# Patient Record
Sex: Female | Born: 1965 | Hispanic: Yes | Marital: Single
Health system: Southern US, Community
[De-identification: ages and names within clinical notes are randomized; demographics above are authoritative.]

## PROBLEM LIST (undated history)

## (undated) HISTORY — PX: BREAST CYST ASPIRATION: SHX578

---

## 2005-11-19 ENCOUNTER — Ambulatory Visit: Payer: Self-pay | Admitting: Family Medicine

## 2013-08-21 ENCOUNTER — Ambulatory Visit: Payer: Self-pay | Admitting: Family Medicine

## 2014-11-25 ENCOUNTER — Ambulatory Visit: Payer: Self-pay

## 2014-11-27 ENCOUNTER — Ambulatory Visit: Payer: Self-pay

## 2014-12-11 ENCOUNTER — Ambulatory Visit
Admission: RE | Admit: 2014-12-11 | Discharge: 2014-12-11 | Disposition: A | Discharge status: Home / Self Care | Payer: Self-pay | Source: Ambulatory Visit | Attending: Oncology | Admitting: Oncology

## 2014-12-11 ENCOUNTER — Ambulatory Visit: Payer: Self-pay | Attending: Oncology

## 2014-12-11 VITALS — BP 106/69 | HR 71 | Temp 99.0°F | Ht 62.0 in | Wt 114.2 lb

## 2014-12-11 DIAGNOSIS — Z0000 Encounter for general adult medical examination without abnormal findings: Secondary | ICD-10-CM

## 2014-12-11 NOTE — Progress Notes (Signed)
Subjective:     Patient ID: Veronica Campbell, female   DOB: 1965/06/24, 49 y.o.   MRN: [id number]  HPI   Review of Systems     Objective:   Physical Exam  Pulmonary/Chest: Right breast exhibits no inverted nipple, no mass, no nipple discharge, no skin change and no tenderness. Left breast exhibits no inverted nipple, no mass, no nipple discharge, no skin change and no tenderness. Breasts are asymmetrical.       Assessment:     49 year old hispanic  patient presents for [facility] clinic visitPatient screened, and meets [facility] eligibility.  Patient does not have insurance, Medicare or Medicaid.  Handout given on Affordable Care Act.  CBE unremarkable.  Right breast 1 cup size larger than left.  Instructed patient on breast self-exam using teach back method. Plan:     Sent for bilateral screening mammogram.  Veronica Campbell interpreted exam.

## 2014-12-15 NOTE — Progress Notes (Signed)
Letter mailed from [facility] to notify of normal mammogram results.  Patient to return in one year for annual screening.  Copy to [facility]. 

## 2016-03-31 ENCOUNTER — Ambulatory Visit: Payer: Self-pay | Attending: Oncology

## 2016-09-08 ENCOUNTER — Other Ambulatory Visit: Payer: Self-pay | Admitting: Primary Care

## 2017-08-17 ENCOUNTER — Ambulatory Visit: Payer: Self-pay | Attending: Oncology

## 2017-08-17 ENCOUNTER — Encounter (INDEPENDENT_AMBULATORY_CARE_PROVIDER_SITE_OTHER): Payer: Self-pay

## 2017-08-17 ENCOUNTER — Ambulatory Visit
Admission: RE | Admit: 2017-08-17 | Discharge: 2017-08-17 | Disposition: A | Discharge status: Home / Self Care | Payer: Self-pay | Source: Ambulatory Visit | Attending: Oncology | Admitting: Oncology

## 2017-08-17 VITALS — BP 113/78 | HR 64 | Temp 97.7°F | Ht 63.0 in | Wt 126.0 lb

## 2017-08-17 DIAGNOSIS — Z0000 Encounter for general adult medical examination without abnormal findings: Secondary | ICD-10-CM | POA: Insufficient documentation

## 2017-08-17 NOTE — Progress Notes (Signed)
  Subjective:     Patient ID: Farrel GordonNorma Lopez Flores, female   DOB: 1965/12/03, 52 y.o.   MRN: [id number]  HPI   Review of Systems     Objective:   Physical Exam  Pulmonary/Chest: Right breast exhibits no inverted nipple, no mass, no nipple discharge, no skin change and no tenderness. Left breast exhibits no inverted nipple, no mass, no nipple discharge, no skin change and no tenderness. Breasts are asymmetrical.  Left breast smaller than right       Assessment:     52 year old hispanic [facility]  Patient screened, and meets [facility] eligibility.  Patient does not have insurance, Medicare or Medicaid.  Handout given on Affordable Care Act.  Instructed patient on breast self awareness using teach back method.  Clinical breast exam unremarkable.  No mass or lump palpated.  Patient has 3 children,  The oldest is 153 years old.      Plan:     Sent for bilateral screening mammogram.

## 2017-08-31 NOTE — Progress Notes (Signed)
Risk Assessment    No risk assessment data for the current encounter   Risk Scores      08/17/2017   Last edited by: Jim LikeLambert, Sheena M, RN   5-year risk: 0.7 %   Lifetime risk: 6.4 %        [facility].  Patient to return in one year for annual screening.  Copy to [facility].

## 2018-08-30 ENCOUNTER — Telehealth: Payer: Self-pay

## 2018-08-30 ENCOUNTER — Other Ambulatory Visit: Payer: Self-pay

## 2018-08-30 DIAGNOSIS — Z20822 Contact with and (suspected) exposure to covid-19: Secondary | ICD-10-CM

## 2018-08-30 NOTE — Telephone Encounter (Signed)
Veronica Gala, RN at [facility] called to refer the patient for covid testing. Patient's son Veronica Campbell called and advised of the referral, he verbalized understanding. Appointment scheduled for today at 1500 at [facility], advised of location and to wear a mask for everyone in the vehicle, he verbalized understanding. Order placed.

## 2018-09-04 LAB — NOVEL CORONAVIRUS, NAA: SARS-CoV-2, NAA: NOT DETECTED

## 2020-01-11 ENCOUNTER — Emergency Department
Admission: EM | Admit: 2020-01-11 | Discharge: 2020-01-11 | Disposition: A | Discharge status: Home / Self Care | Payer: Self-pay | Attending: Emergency Medicine | Admitting: Emergency Medicine

## 2020-01-11 DIAGNOSIS — Z5321 Procedure and treatment not carried out due to patient leaving prior to being seen by health care provider: Secondary | ICD-10-CM | POA: Insufficient documentation

## 2020-01-11 DIAGNOSIS — Y92410 Unspecified street and highway as the place of occurrence of the external cause: Secondary | ICD-10-CM | POA: Insufficient documentation

## 2020-01-11 DIAGNOSIS — M542 Cervicalgia: Secondary | ICD-10-CM | POA: Insufficient documentation

## 2020-01-11 DIAGNOSIS — M549 Dorsalgia, unspecified: Secondary | ICD-10-CM | POA: Insufficient documentation
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
## 2020-01-24 ENCOUNTER — Other Ambulatory Visit: Payer: Self-pay | Admitting: Primary Care

## 2020-01-24 DIAGNOSIS — Z1231 Encounter for screening mammogram for malignant neoplasm of breast: Secondary | ICD-10-CM

## 2020-02-05 ENCOUNTER — Other Ambulatory Visit: Payer: Self-pay | Admitting: Primary Care

## 2020-02-05 DIAGNOSIS — R590 Localized enlarged lymph nodes: Secondary | ICD-10-CM

## 2020-02-06 ENCOUNTER — Ambulatory Visit: Payer: Self-pay

## 2020-04-10 ENCOUNTER — Other Ambulatory Visit: Payer: Self-pay | Admitting: Neurology

## 2020-04-10 ENCOUNTER — Other Ambulatory Visit (HOSPITAL_COMMUNITY): Payer: Self-pay | Admitting: Neurology

## 2020-04-10 DIAGNOSIS — M7918 Myalgia, other site: Secondary | ICD-10-CM

## 2020-04-10 DIAGNOSIS — G44311 Acute post-traumatic headache, intractable: Secondary | ICD-10-CM

## 2020-04-10 DIAGNOSIS — G43019 Migraine without aura, intractable, without status migrainosus: Secondary | ICD-10-CM

## 2020-04-25 ENCOUNTER — Other Ambulatory Visit: Payer: Self-pay

## 2020-04-25 ENCOUNTER — Ambulatory Visit
Admission: RE | Admit: 2020-04-25 | Discharge: 2020-04-25 | Disposition: A | Discharge status: Home / Self Care | Payer: Commercial Managed Care - PPO | Source: Ambulatory Visit | Attending: Neurology | Admitting: Neurology

## 2020-04-25 DIAGNOSIS — G44311 Acute post-traumatic headache, intractable: Secondary | ICD-10-CM | POA: Insufficient documentation

## 2020-04-25 DIAGNOSIS — M7918 Myalgia, other site: Secondary | ICD-10-CM | POA: Diagnosis present

## 2020-04-25 DIAGNOSIS — G43019 Migraine without aura, intractable, without status migrainosus: Secondary | ICD-10-CM | POA: Diagnosis present

## 2020-09-15 ENCOUNTER — Other Ambulatory Visit: Payer: Self-pay | Admitting: Orthopedic Surgery

## 2020-09-15 ENCOUNTER — Other Ambulatory Visit (HOSPITAL_COMMUNITY): Payer: Self-pay | Admitting: Orthopedic Surgery

## 2020-09-15 DIAGNOSIS — M62838 Other muscle spasm: Secondary | ICD-10-CM

## 2020-09-15 DIAGNOSIS — M47812 Spondylosis without myelopathy or radiculopathy, cervical region: Secondary | ICD-10-CM

## 2020-09-15 DIAGNOSIS — M5412 Radiculopathy, cervical region: Secondary | ICD-10-CM

## 2020-09-23 ENCOUNTER — Ambulatory Visit: Payer: Commercial Managed Care - PPO

## 2021-02-10 ENCOUNTER — Other Ambulatory Visit (HOSPITAL_COMMUNITY): Payer: Self-pay | Admitting: Primary Care

## 2021-02-10 DIAGNOSIS — Z1231 Encounter for screening mammogram for malignant neoplasm of breast: Secondary | ICD-10-CM

## 2021-02-18 ENCOUNTER — Ambulatory Visit (HOSPITAL_COMMUNITY): Payer: Commercial Managed Care - PPO

## 2021-02-18 ENCOUNTER — Other Ambulatory Visit: Payer: Self-pay

## 2021-02-18 ENCOUNTER — Ambulatory Visit
Admission: RE | Admit: 2021-02-18 | Discharge: 2021-02-18 | Disposition: A | Discharge status: Home / Self Care | Payer: Commercial Managed Care - PPO | Source: Ambulatory Visit | Attending: Primary Care | Admitting: Primary Care

## 2021-02-18 ENCOUNTER — Other Ambulatory Visit: Payer: Self-pay | Admitting: Primary Care

## 2021-02-18 DIAGNOSIS — Z1231 Encounter for screening mammogram for malignant neoplasm of breast: Secondary | ICD-10-CM | POA: Insufficient documentation

## 2021-11-02 ENCOUNTER — Other Ambulatory Visit: Payer: Self-pay | Admitting: Primary Care

## 2021-11-02 DIAGNOSIS — Z1231 Encounter for screening mammogram for malignant neoplasm of breast: Secondary | ICD-10-CM

## 2022-02-19 ENCOUNTER — Ambulatory Visit
Admission: RE | Admit: 2022-02-19 | Discharge: 2022-02-19 | Disposition: A | Discharge status: Home / Self Care | Payer: BLUE CROSS/BLUE SHIELD | Source: Ambulatory Visit | Attending: Primary Care | Admitting: Primary Care

## 2022-02-19 DIAGNOSIS — Z1231 Encounter for screening mammogram for malignant neoplasm of breast: Secondary | ICD-10-CM | POA: Diagnosis present

## 2022-03-22 ENCOUNTER — Other Ambulatory Visit: Payer: Self-pay | Admitting: Primary Care

## 2022-03-22 DIAGNOSIS — R945 Abnormal results of liver function studies: Secondary | ICD-10-CM

## 2022-03-30 ENCOUNTER — Ambulatory Visit: Admission: RE | Admit: 2022-03-30 | Payer: BLUE CROSS/BLUE SHIELD | Source: Ambulatory Visit

## 2022-04-21 ENCOUNTER — Other Ambulatory Visit: Payer: Self-pay | Admitting: Orthopedic Surgery

## 2022-04-21 DIAGNOSIS — M5416 Radiculopathy, lumbar region: Secondary | ICD-10-CM

## 2022-04-27 ENCOUNTER — Other Ambulatory Visit: Payer: Self-pay | Admitting: Physical Medicine & Rehabilitation

## 2022-04-27 DIAGNOSIS — M9931 Osseous stenosis of neural canal of cervical region: Secondary | ICD-10-CM

## 2022-07-21 IMAGING — MG MM DIGITAL SCREENING BILAT W/ TOMO AND CAD
8 series · 9 of 24 positions shown · non-contrast
Comparison: Previous exam(s).

CLINICAL DATA: Screening.

EXAM:
DIGITAL SCREENING BILATERAL MAMMOGRAM WITH TOMOSYNTHESIS AND CAD
TECHNIQUE: Bilateral screening digital craniocaudal and mediolateral oblique
mammograms were obtained. Bilateral screening digital breast
tomosynthesis was performed. The images were evaluated with
computer-aided detection.

[L MLO synth-2D]
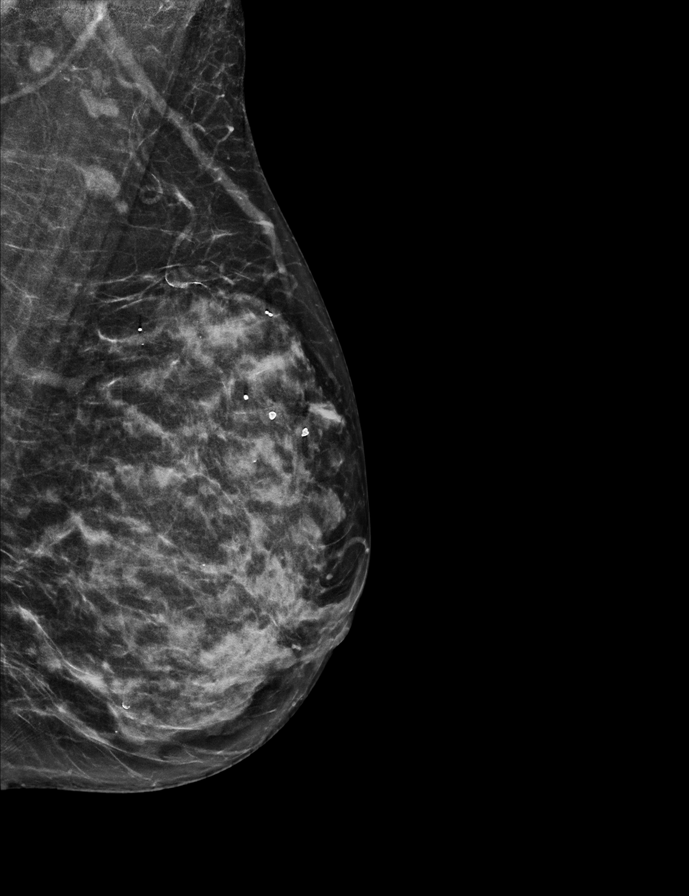

[L CC synth-2D]
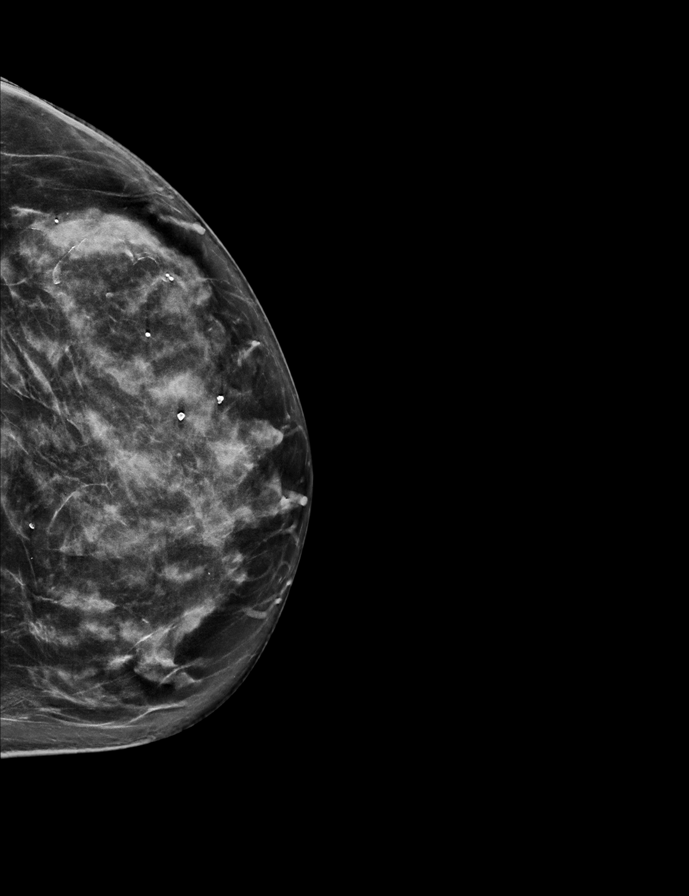

[R MLO synth-2D]
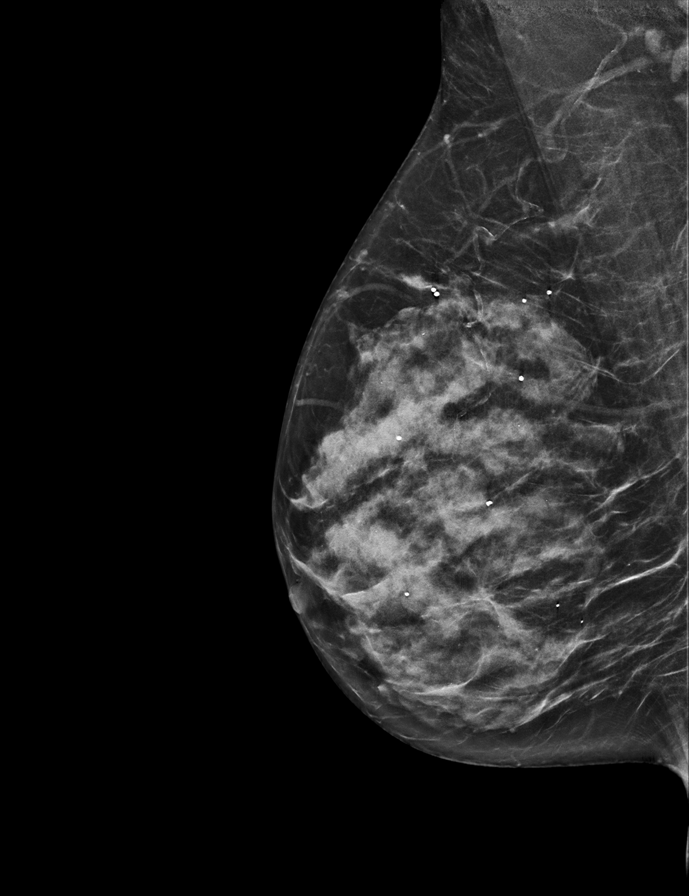

[R CC synth-2D]
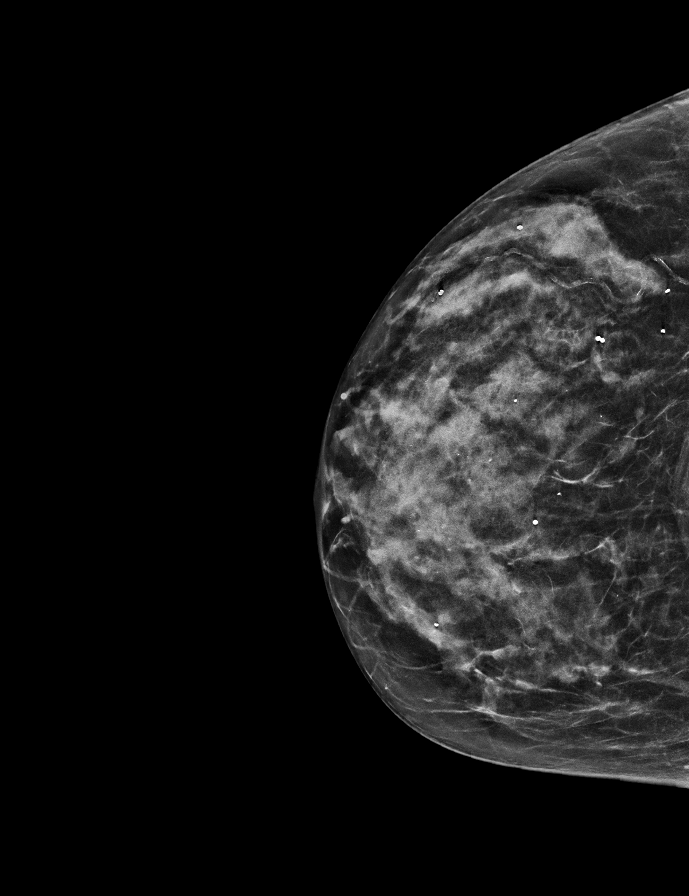

[L MLO tomo · 2 of 57 frames shown]
[frame 19/57]
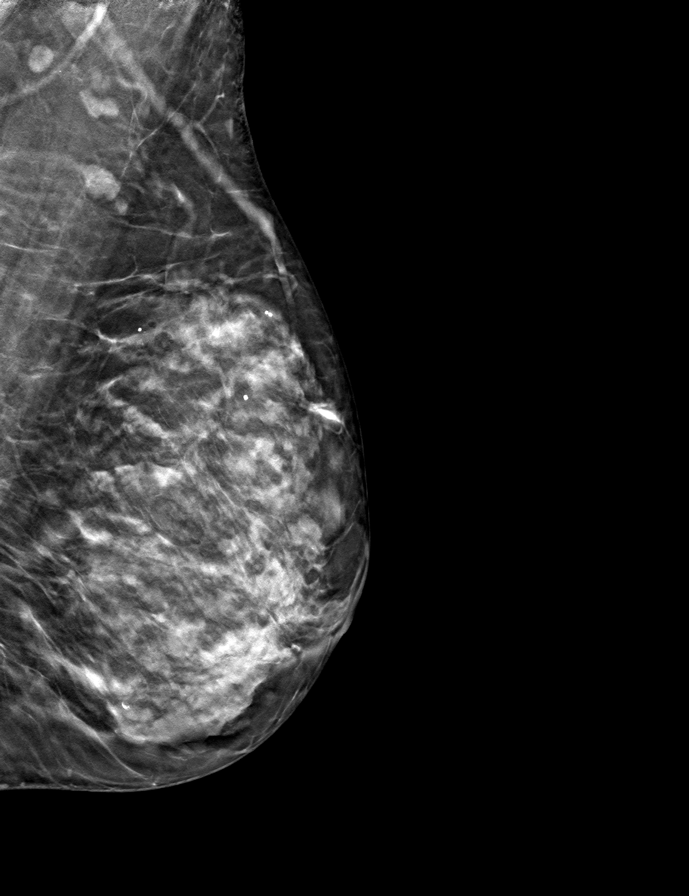
[frame 29/57]
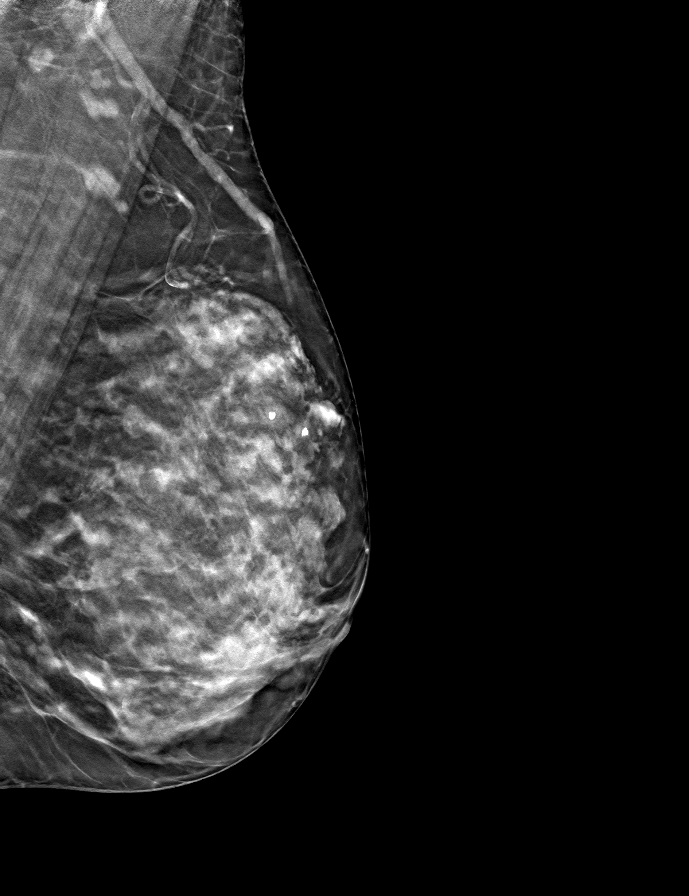

[L CC tomo · tomo slice 31/60.0]
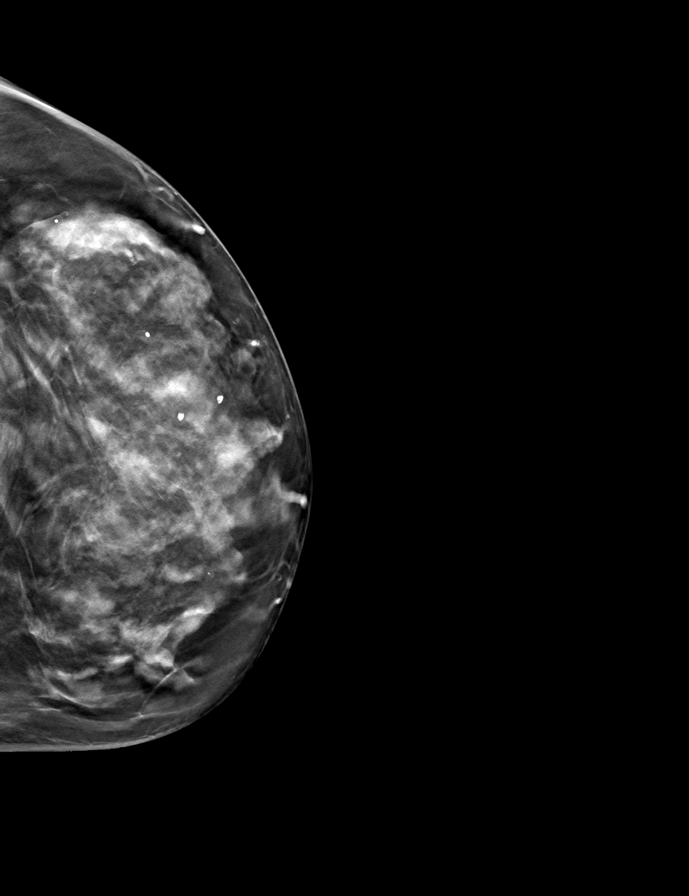

[R MLO tomo · tomo slice 29/58.0]
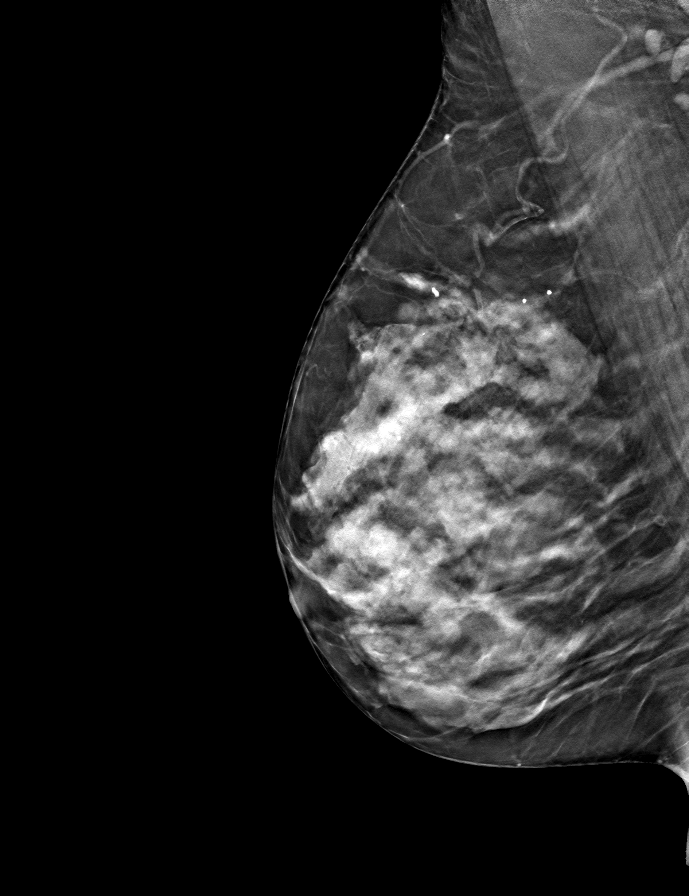

[R CC tomo · tomo slice 27/52.0]
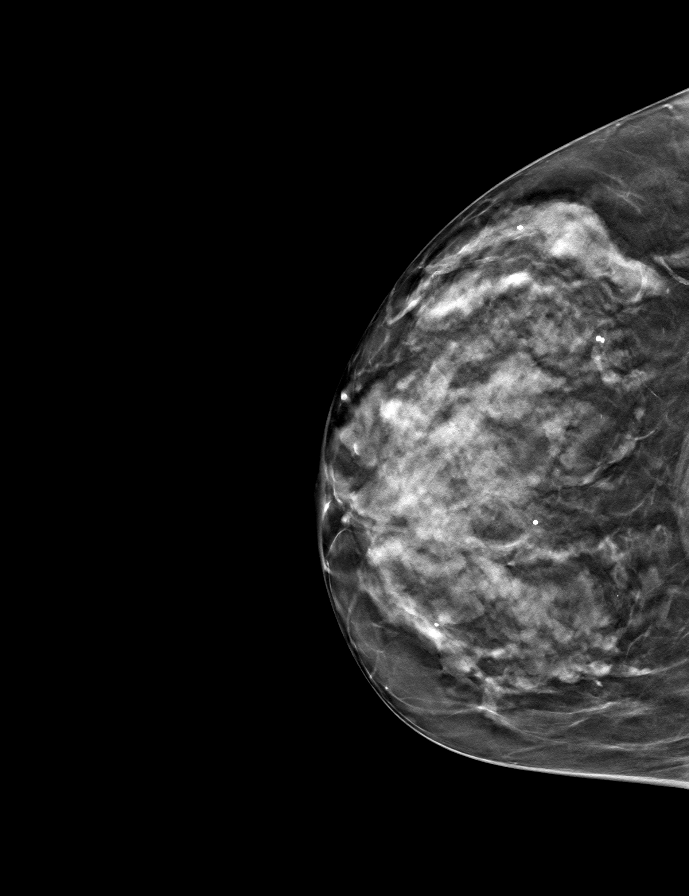

[9 of 24 positions shown; findings below may reference images not displayed]

ACR Breast Density Category c: The breast tissue is heterogeneously
dense, which may obscure small masses.
FINDINGS: There are no findings suspicious for malignancy.
IMPRESSION: No mammographic evidence of malignancy. A result letter of this
screening mammogram will be mailed directly to the patient.

RECOMMENDATION:
Screening mammogram in one year. (Code:Q3-W-BC3)

BI-RADS CATEGORY  1: Negative.

## 2022-11-04 ENCOUNTER — Encounter: Payer: Self-pay | Admitting: Family Medicine

## 2022-11-04 DIAGNOSIS — N6325 Unspecified lump in the left breast, overlapping quadrants: Secondary | ICD-10-CM

## 2022-11-04 DIAGNOSIS — N6459 Other signs and symptoms in breast: Secondary | ICD-10-CM

## 2022-11-04 DIAGNOSIS — N644 Mastodynia: Secondary | ICD-10-CM

## 2022-11-09 ENCOUNTER — Other Ambulatory Visit: Payer: Self-pay | Admitting: Family Medicine

## 2022-11-09 DIAGNOSIS — N6459 Other signs and symptoms in breast: Secondary | ICD-10-CM

## 2022-11-09 DIAGNOSIS — N63 Unspecified lump in unspecified breast: Secondary | ICD-10-CM

## 2022-11-09 DIAGNOSIS — N644 Mastodynia: Secondary | ICD-10-CM

## 2022-11-23 ENCOUNTER — Ambulatory Visit
Admission: RE | Admit: 2022-11-23 | Discharge: 2022-11-23 | Disposition: A | Discharge status: Home / Self Care | Payer: BLUE CROSS/BLUE SHIELD | Source: Ambulatory Visit | Attending: Family Medicine | Admitting: Family Medicine

## 2022-11-23 DIAGNOSIS — N6459 Other signs and symptoms in breast: Secondary | ICD-10-CM | POA: Insufficient documentation

## 2022-11-23 DIAGNOSIS — N63 Unspecified lump in unspecified breast: Secondary | ICD-10-CM

## 2022-11-23 DIAGNOSIS — N644 Mastodynia: Secondary | ICD-10-CM | POA: Insufficient documentation

## 2022-12-29 ENCOUNTER — Other Ambulatory Visit: Payer: Self-pay | Admitting: Family Medicine

## 2022-12-29 DIAGNOSIS — N6459 Other signs and symptoms in breast: Secondary | ICD-10-CM

## 2023-01-07 ENCOUNTER — Ambulatory Visit: Admission: RE | Admit: 2023-01-07 | Payer: BLUE CROSS/BLUE SHIELD | Source: Ambulatory Visit

## 2023-01-07 ENCOUNTER — Ambulatory Visit
Admission: RE | Admit: 2023-01-07 | Discharge: 2023-01-07 | Disposition: A | Discharge status: Home / Self Care | Payer: BLUE CROSS/BLUE SHIELD | Source: Ambulatory Visit | Attending: Family Medicine | Admitting: Family Medicine

## 2023-01-07 DIAGNOSIS — N6459 Other signs and symptoms in breast: Secondary | ICD-10-CM | POA: Insufficient documentation

## 2023-01-07 MED ORDER — GADOBUTROL 1 MMOL/ML IV SOLN
6.0000 mL | Freq: Once | INTRAVENOUS | Status: AC | PRN
  Administered 2023-01-07: 6 mL via INTRAVENOUS

## 2023-07-11 ENCOUNTER — Other Ambulatory Visit: Payer: Self-pay | Admitting: Primary Care

## 2023-07-11 DIAGNOSIS — N6325 Unspecified lump in the left breast, overlapping quadrants: Secondary | ICD-10-CM

## 2023-07-20 ENCOUNTER — Ambulatory Visit
Admission: RE | Admit: 2023-07-20 | Discharge: 2023-07-20 | Disposition: A | Discharge status: Home / Self Care | Source: Ambulatory Visit | Attending: Primary Care | Admitting: Primary Care

## 2023-07-20 DIAGNOSIS — N6325 Unspecified lump in the left breast, overlapping quadrants: Secondary | ICD-10-CM | POA: Diagnosis present

## 2023-07-20 MED ORDER — GADOBUTROL 1 MMOL/ML IV SOLN
6.0000 mL | Freq: Once | INTRAVENOUS | Status: AC | PRN
  Administered 2023-07-20: 6 mL via INTRAVENOUS

## 2023-10-18 ENCOUNTER — Other Ambulatory Visit: Payer: Self-pay | Admitting: Primary Care

## 2023-10-18 DIAGNOSIS — Z1231 Encounter for screening mammogram for malignant neoplasm of breast: Secondary | ICD-10-CM

## 2023-11-28 ENCOUNTER — Ambulatory Visit
Admission: RE | Admit: 2023-11-28 | Discharge: 2023-11-28 | Disposition: A | Discharge status: Home / Self Care | Source: Ambulatory Visit | Attending: Primary Care | Admitting: Primary Care

## 2023-11-28 ENCOUNTER — Ambulatory Visit (HOSPITAL_COMMUNITY)

## 2023-11-28 DIAGNOSIS — Z1231 Encounter for screening mammogram for malignant neoplasm of breast: Secondary | ICD-10-CM | POA: Diagnosis present
